# Patient Record
Sex: Female | Born: 1955 | Race: Black or African American | Hispanic: No | Smoking: Never smoker
Health system: Southern US, Community
[De-identification: ages and names within clinical notes are randomized; demographics above are authoritative.]

## PROBLEM LIST (undated history)

## (undated) ENCOUNTER — Emergency Department (HOSPITAL_BASED_OUTPATIENT_CLINIC_OR_DEPARTMENT_OTHER): Disposition: A | Discharge status: Still In Hospital | Payer: Self-pay

## (undated) DIAGNOSIS — I10 Essential (primary) hypertension: Secondary | ICD-10-CM

## (undated) DIAGNOSIS — C801 Malignant (primary) neoplasm, unspecified: Secondary | ICD-10-CM

---

## 2004-11-17 ENCOUNTER — Emergency Department (HOSPITAL_COMMUNITY): Admission: EM | Admit: 2004-11-17 | Discharge: 2004-11-18 | Payer: Self-pay | Admitting: Emergency Medicine

## 2009-02-23 ENCOUNTER — Ambulatory Visit: Payer: Self-pay | Admitting: Diagnostic Radiology

## 2009-02-23 ENCOUNTER — Observation Stay (HOSPITAL_COMMUNITY): Admission: EM | Admit: 2009-02-23 | Discharge: 2009-02-24 | Payer: Self-pay | Admitting: Internal Medicine

## 2009-02-23 ENCOUNTER — Encounter: Payer: Self-pay | Admitting: Emergency Medicine

## 2009-03-24 ENCOUNTER — Emergency Department (HOSPITAL_BASED_OUTPATIENT_CLINIC_OR_DEPARTMENT_OTHER): Admission: EM | Admit: 2009-03-24 | Discharge: 2009-03-24 | Payer: Self-pay | Admitting: Emergency Medicine

## 2009-03-26 ENCOUNTER — Emergency Department (HOSPITAL_BASED_OUTPATIENT_CLINIC_OR_DEPARTMENT_OTHER): Admission: EM | Admit: 2009-03-26 | Discharge: 2009-03-26 | Payer: Self-pay | Admitting: Emergency Medicine

## 2009-03-28 ENCOUNTER — Emergency Department (HOSPITAL_BASED_OUTPATIENT_CLINIC_OR_DEPARTMENT_OTHER): Admission: EM | Admit: 2009-03-28 | Discharge: 2009-03-28 | Payer: Self-pay | Admitting: Emergency Medicine

## 2009-06-27 ENCOUNTER — Ambulatory Visit: Payer: Self-pay | Admitting: Radiology

## 2009-06-27 ENCOUNTER — Emergency Department (HOSPITAL_BASED_OUTPATIENT_CLINIC_OR_DEPARTMENT_OTHER): Admission: EM | Admit: 2009-06-27 | Discharge: 2009-06-28 | Payer: Self-pay | Admitting: Emergency Medicine

## 2009-07-07 ENCOUNTER — Emergency Department (HOSPITAL_BASED_OUTPATIENT_CLINIC_OR_DEPARTMENT_OTHER): Admission: EM | Admit: 2009-07-07 | Discharge: 2009-07-07 | Payer: Self-pay | Admitting: Emergency Medicine

## 2010-04-09 LAB — CBC
HCT: 39.2 % (ref 36.0–46.0)
HCT: 42.8 % (ref 36.0–46.0)
Hemoglobin: 13.6 g/dL (ref 12.0–15.0)
Hemoglobin: 14.5 g/dL (ref 12.0–15.0)
MCHC: 33.9 g/dL (ref 30.0–36.0)
MCHC: 34.7 g/dL (ref 30.0–36.0)
MCV: 86.7 fL (ref 78.0–100.0)
MCV: 88.4 fL (ref 78.0–100.0)
Platelets: 284 10*3/uL (ref 150–400)
Platelets: 344 10*3/uL (ref 150–400)
RBC: 4.43 MIL/uL (ref 3.87–5.11)
RBC: 4.94 MIL/uL (ref 3.87–5.11)
RDW: 12.8 % (ref 11.5–15.5)
RDW: 13.1 % (ref 11.5–15.5)
WBC: 3.8 10*3/uL — ABNORMAL LOW (ref 4.0–10.5)
WBC: 4.2 10*3/uL (ref 4.0–10.5)

## 2010-04-09 LAB — HEMOGLOBIN A1C
Hgb A1c MFr Bld: 6.8 % — ABNORMAL HIGH (ref 4.6–6.1)
Mean Plasma Glucose: 148 mg/dL

## 2010-04-09 LAB — COMPREHENSIVE METABOLIC PANEL
ALT: <6 U/L (ref 0–35)
AST: 19 U/L (ref 0–37)
Albumin: 4.4 g/dL (ref 3.5–5.2)
Alkaline Phosphatase: 80 U/L (ref 39–117)
BUN: 15 mg/dL (ref 6–23)
CO2: 29 mEq/L (ref 19–32)
Calcium: 9.3 mg/dL (ref 8.4–10.5)
Chloride: 101 mEq/L (ref 96–112)
Creatinine, Ser: 1 mg/dL (ref 0.4–1.2)
GFR calc Af Amer: >60 mL/min (ref >60)
GFR calc non Af Amer: 58 mL/min — ABNORMAL LOW (ref >60)
Glucose, Bld: 182 mg/dL — ABNORMAL HIGH (ref 70–99)
Potassium: 3.5 mEq/L (ref 3.5–5.1)
Sodium: 140 mEq/L (ref 135–145)
Total Bilirubin: 0.5 mg/dL (ref 0.3–1.2)
Total Protein: 8.2 g/dL (ref 6.0–8.3)

## 2010-04-09 LAB — CARDIAC PANEL(CRET KIN+CKTOT+MB+TROPI)
CK, MB: 0.7 ng/mL (ref 0.3–4.0)
CK, MB: 0.8 ng/mL (ref 0.3–4.0)
CK, MB: 0.8 ng/mL (ref 0.3–4.0)
Relative Index: 0.7 (ref 0.0–2.5)
Relative Index: INVALID (ref 0.0–2.5)
Relative Index: INVALID (ref 0.0–2.5)
Total CK: 109 U/L (ref 7–177)
Total CK: 89 U/L (ref 7–177)
Total CK: 96 U/L (ref 7–177)
Troponin I: 0.01 ng/mL (ref 0.00–0.06)
Troponin I: 0.02 ng/mL (ref 0.00–0.06)
Troponin I: 0.02 ng/mL (ref 0.00–0.06)

## 2010-04-09 LAB — URINALYSIS, ROUTINE W REFLEX MICROSCOPIC
Bilirubin Urine: NEGATIVE
Glucose, UA: NEGATIVE mg/dL
Ketones, ur: NEGATIVE mg/dL
Leukocytes, UA: NEGATIVE
Nitrite: NEGATIVE
Protein, ur: NEGATIVE mg/dL
Specific Gravity, Urine: 1.014 (ref 1.005–1.030)
Urobilinogen, UA: 0.2 mg/dL (ref 0.0–1.0)
pH: 5 (ref 5.0–8.0)

## 2010-04-09 LAB — BASIC METABOLIC PANEL
BUN: 15 mg/dL (ref 6–23)
CO2: 28 mEq/L (ref 19–32)
Calcium: 9 mg/dL (ref 8.4–10.5)
Chloride: 101 mEq/L (ref 96–112)
Creatinine, Ser: 1.06 mg/dL (ref 0.4–1.2)
GFR calc Af Amer: >60 mL/min (ref >60)
GFR calc non Af Amer: 54 mL/min — ABNORMAL LOW (ref >60)
Glucose, Bld: 137 mg/dL — ABNORMAL HIGH (ref 70–99)
Potassium: 3.8 mEq/L (ref 3.5–5.1)
Sodium: 136 mEq/L (ref 135–145)

## 2010-04-09 LAB — URINE MICROSCOPIC-ADD ON

## 2010-04-09 LAB — DIFFERENTIAL
Basophils Absolute: 0 10*3/uL (ref 0.0–0.1)
Basophils Relative: 1 % (ref 0–1)
Eosinophils Absolute: 0.1 10*3/uL (ref 0.0–0.7)
Eosinophils Relative: 2 % (ref 0–5)
Lymphocytes Relative: 50 % — ABNORMAL HIGH (ref 12–46)
Lymphs Abs: 2.1 10*3/uL (ref 0.7–4.0)
Monocytes Absolute: 0.3 10*3/uL (ref 0.1–1.0)
Monocytes Relative: 7 % (ref 3–12)
Neutro Abs: 1.7 10*3/uL (ref 1.7–7.7)
Neutrophils Relative %: 39 % — ABNORMAL LOW (ref 43–77)

## 2010-04-09 LAB — POCT CARDIAC MARKERS
CKMB, poc: <1 ng/mL — ABNORMAL LOW (ref 1.0–8.0)
Myoglobin, poc: 56.3 ng/mL (ref 12–200)
Troponin i, poc: <0.05 ng/mL (ref 0.00–0.09)

## 2010-04-09 LAB — LIPASE, BLOOD: Lipase: 65 U/L (ref 23–300)

## 2010-04-09 LAB — TSH: TSH: 4.252 u[IU]/mL (ref 0.350–4.500)

## 2010-04-09 LAB — GLUCOSE, CAPILLARY
Glucose-Capillary: 103 mg/dL — ABNORMAL HIGH (ref 70–99)
Glucose-Capillary: 114 mg/dL — ABNORMAL HIGH (ref 70–99)
Glucose-Capillary: 116 mg/dL — ABNORMAL HIGH (ref 70–99)
Glucose-Capillary: 124 mg/dL — ABNORMAL HIGH (ref 70–99)
Glucose-Capillary: 94 mg/dL (ref 70–99)
Glucose-Capillary: 98 mg/dL (ref 70–99)

## 2010-04-09 LAB — LIPID PANEL
Cholesterol: 248 mg/dL — ABNORMAL HIGH (ref 0–200)
HDL: 80 mg/dL (ref >39)
LDL Cholesterol: 155 mg/dL — ABNORMAL HIGH (ref 0–99)
Total CHOL/HDL Ratio: 3.1 RATIO
Triglycerides: 64 mg/dL (ref <150)
VLDL: 13 mg/dL (ref 0–40)

## 2010-04-09 LAB — MAGNESIUM: Magnesium: 2 mg/dL (ref 1.5–2.5)

## 2010-04-13 LAB — GLUCOSE, CAPILLARY: Glucose-Capillary: 134 mg/dL — ABNORMAL HIGH (ref 70–99)

## 2010-04-23 ENCOUNTER — Emergency Department (HOSPITAL_BASED_OUTPATIENT_CLINIC_OR_DEPARTMENT_OTHER)
Admission: EM | Admit: 2010-04-23 | Discharge: 2010-04-23 | Disposition: A | Discharge status: Home / Self Care | Payer: Self-pay | Attending: Emergency Medicine | Admitting: Emergency Medicine

## 2010-04-23 DIAGNOSIS — IMO0001 Reserved for inherently not codable concepts without codable children: Secondary | ICD-10-CM | POA: Insufficient documentation

## 2010-04-23 DIAGNOSIS — I10 Essential (primary) hypertension: Secondary | ICD-10-CM | POA: Insufficient documentation

## 2010-04-23 DIAGNOSIS — E039 Hypothyroidism, unspecified: Secondary | ICD-10-CM | POA: Insufficient documentation

## 2010-04-23 DIAGNOSIS — E119 Type 2 diabetes mellitus without complications: Secondary | ICD-10-CM | POA: Insufficient documentation

## 2010-06-08 ENCOUNTER — Emergency Department (HOSPITAL_BASED_OUTPATIENT_CLINIC_OR_DEPARTMENT_OTHER)
Admission: EM | Admit: 2010-06-08 | Discharge: 2010-06-08 | Disposition: A | Discharge status: Home / Self Care | Payer: Self-pay | Attending: Emergency Medicine | Admitting: Emergency Medicine

## 2010-06-08 ENCOUNTER — Emergency Department (INDEPENDENT_AMBULATORY_CARE_PROVIDER_SITE_OTHER): Payer: Self-pay

## 2010-06-08 DIAGNOSIS — E039 Hypothyroidism, unspecified: Secondary | ICD-10-CM | POA: Insufficient documentation

## 2010-06-08 DIAGNOSIS — R079 Chest pain, unspecified: Secondary | ICD-10-CM

## 2010-06-08 DIAGNOSIS — E119 Type 2 diabetes mellitus without complications: Secondary | ICD-10-CM | POA: Insufficient documentation

## 2010-06-08 DIAGNOSIS — I10 Essential (primary) hypertension: Secondary | ICD-10-CM | POA: Insufficient documentation

## 2010-06-08 DIAGNOSIS — R071 Chest pain on breathing: Secondary | ICD-10-CM | POA: Insufficient documentation

## 2010-06-08 LAB — BASIC METABOLIC PANEL
CO2: 29 mEq/L (ref 19–32)
Calcium: 10.2 mg/dL (ref 8.4–10.5)
Creatinine, Ser: 1 mg/dL (ref 0.4–1.2)
GFR calc Af Amer: >60 mL/min (ref >60)
GFR calc non Af Amer: 57 mL/min — ABNORMAL LOW (ref >60)
Glucose, Bld: 168 mg/dL — ABNORMAL HIGH (ref 70–99)
Sodium: 137 mEq/L (ref 135–145)

## 2010-06-08 LAB — CARDIAC PANEL(CRET KIN+CKTOT+MB+TROPI)
CK, MB: 1.8 ng/mL (ref 0.3–4.0)
Total CK: 133 U/L (ref 7–177)

## 2010-06-08 LAB — DIFFERENTIAL
Basophils Absolute: 0 10*3/uL (ref 0.0–0.1)
Eosinophils Absolute: 0.1 10*3/uL (ref 0.0–0.7)
Eosinophils Relative: 1 % (ref 0–5)
Monocytes Absolute: 0.4 10*3/uL (ref 0.1–1.0)

## 2010-06-08 LAB — CBC
MCHC: 35.1 g/dL (ref 30.0–36.0)
Platelets: 329 10*3/uL (ref 150–400)
RDW: 13 % (ref 11.5–15.5)
WBC: 4.6 10*3/uL (ref 4.0–10.5)

## 2010-06-08 LAB — CK TOTAL AND CKMB (NOT AT ARMC)
CK, MB: 1.7 ng/mL (ref 0.3–4.0)
Relative Index: 1.4 (ref 0.0–2.5)

## 2010-07-01 ENCOUNTER — Emergency Department (HOSPITAL_BASED_OUTPATIENT_CLINIC_OR_DEPARTMENT_OTHER)
Admission: EM | Admit: 2010-07-01 | Discharge: 2010-07-01 | Disposition: A | Discharge status: Home / Self Care | Payer: Self-pay | Attending: Emergency Medicine | Admitting: Emergency Medicine

## 2010-07-01 DIAGNOSIS — J029 Acute pharyngitis, unspecified: Secondary | ICD-10-CM | POA: Insufficient documentation

## 2010-07-01 DIAGNOSIS — E039 Hypothyroidism, unspecified: Secondary | ICD-10-CM | POA: Insufficient documentation

## 2010-07-01 DIAGNOSIS — I10 Essential (primary) hypertension: Secondary | ICD-10-CM | POA: Insufficient documentation

## 2010-07-01 DIAGNOSIS — E119 Type 2 diabetes mellitus without complications: Secondary | ICD-10-CM | POA: Insufficient documentation

## 2010-11-10 ENCOUNTER — Encounter: Payer: Self-pay | Admitting: *Deleted

## 2010-11-10 ENCOUNTER — Emergency Department (HOSPITAL_BASED_OUTPATIENT_CLINIC_OR_DEPARTMENT_OTHER)
Admission: EM | Admit: 2010-11-10 | Discharge: 2010-11-10 | Disposition: A | Discharge status: Home / Self Care | Payer: Self-pay | Attending: Emergency Medicine | Admitting: Emergency Medicine

## 2010-11-10 ENCOUNTER — Emergency Department (INDEPENDENT_AMBULATORY_CARE_PROVIDER_SITE_OTHER): Payer: Self-pay

## 2010-11-10 DIAGNOSIS — I10 Essential (primary) hypertension: Secondary | ICD-10-CM | POA: Insufficient documentation

## 2010-11-10 DIAGNOSIS — M25539 Pain in unspecified wrist: Secondary | ICD-10-CM

## 2010-11-10 DIAGNOSIS — M7989 Other specified soft tissue disorders: Secondary | ICD-10-CM

## 2010-11-10 DIAGNOSIS — IMO0002 Reserved for concepts with insufficient information to code with codable children: Secondary | ICD-10-CM | POA: Insufficient documentation

## 2010-11-10 DIAGNOSIS — S6730XA Crushing injury of unspecified wrist, initial encounter: Secondary | ICD-10-CM | POA: Insufficient documentation

## 2010-11-10 DIAGNOSIS — E119 Type 2 diabetes mellitus without complications: Secondary | ICD-10-CM | POA: Insufficient documentation

## 2010-11-10 DIAGNOSIS — W230XXA Caught, crushed, jammed, or pinched between moving objects, initial encounter: Secondary | ICD-10-CM

## 2010-11-10 HISTORY — DX: Malignant (primary) neoplasm, unspecified: C80.1

## 2010-11-10 HISTORY — DX: Essential (primary) hypertension: I10

## 2010-11-10 MED ORDER — HYDROCODONE-ACETAMINOPHEN 5-325 MG PO TABS: 1.0000 | ORAL_TABLET | Freq: Four times a day (QID) | ORAL | Status: AC | PRN

## 2010-11-10 MED ORDER — HYDROCODONE-ACETAMINOPHEN 5-325 MG PO TABS
2.0000 | ORAL_TABLET | Freq: Once | ORAL | Status: AC
  Administered 2010-11-10: 2 via ORAL
  Filled 2010-11-10: qty 2
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
## 2011-06-29 ENCOUNTER — Emergency Department (HOSPITAL_BASED_OUTPATIENT_CLINIC_OR_DEPARTMENT_OTHER): Payer: No Typology Code available for payment source

## 2011-06-29 DIAGNOSIS — I10 Essential (primary) hypertension: Secondary | ICD-10-CM | POA: Insufficient documentation

## 2011-06-29 DIAGNOSIS — S60219A Contusion of unspecified wrist, initial encounter: Secondary | ICD-10-CM | POA: Insufficient documentation

## 2011-06-29 DIAGNOSIS — M25539 Pain in unspecified wrist: Secondary | ICD-10-CM | POA: Insufficient documentation
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
## 2011-06-30 ENCOUNTER — Emergency Department (HOSPITAL_BASED_OUTPATIENT_CLINIC_OR_DEPARTMENT_OTHER)
Admission: EM | Admit: 2011-06-30 | Discharge: 2011-06-30 | Disposition: A | Discharge status: Home / Self Care | Payer: No Typology Code available for payment source | Attending: Emergency Medicine | Admitting: Emergency Medicine

## 2011-06-30 ENCOUNTER — Encounter (HOSPITAL_BASED_OUTPATIENT_CLINIC_OR_DEPARTMENT_OTHER): Payer: Self-pay | Admitting: Emergency Medicine

## 2011-06-30 DIAGNOSIS — S60219A Contusion of unspecified wrist, initial encounter: Secondary | ICD-10-CM

## 2011-06-30 MED ORDER — OXYCODONE-ACETAMINOPHEN 5-325 MG PO TABS
2.0000 | ORAL_TABLET | Freq: Once | ORAL | Status: AC
  Administered 2011-06-30: 2 via ORAL

## 2011-06-30 MED ORDER — OXYCODONE-ACETAMINOPHEN 5-325 MG PO TABS
ORAL_TABLET | ORAL | Status: AC
  Administered 2011-06-30: 2 via ORAL
  Filled 2011-06-30: qty 2
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
## 2011-06-30 NOTE — ED Notes (Signed)
Pt provided downtime d/c instructions

## 2011-06-30 NOTE — Discharge Instructions (Signed)

## 2011-06-30 NOTE — ED Notes (Signed)
Pt rated pain 6/10 , percocet given and wrist splint applied prior to d/c

## 2011-08-19 ENCOUNTER — Emergency Department (HOSPITAL_BASED_OUTPATIENT_CLINIC_OR_DEPARTMENT_OTHER)
Admission: EM | Admit: 2011-08-19 | Discharge: 2011-08-19 | Disposition: A | Discharge status: Home / Self Care | Payer: Self-pay | Attending: Emergency Medicine | Admitting: Emergency Medicine

## 2011-08-19 ENCOUNTER — Encounter (HOSPITAL_BASED_OUTPATIENT_CLINIC_OR_DEPARTMENT_OTHER): Payer: Self-pay | Admitting: Emergency Medicine

## 2011-08-19 DIAGNOSIS — L089 Local infection of the skin and subcutaneous tissue, unspecified: Secondary | ICD-10-CM | POA: Insufficient documentation

## 2011-08-19 DIAGNOSIS — I10 Essential (primary) hypertension: Secondary | ICD-10-CM | POA: Insufficient documentation

## 2011-08-19 DIAGNOSIS — E119 Type 2 diabetes mellitus without complications: Secondary | ICD-10-CM | POA: Insufficient documentation

## 2011-08-19 LAB — GLUCOSE, CAPILLARY: Glucose-Capillary: 130 mg/dL — ABNORMAL HIGH (ref 70–99)

## 2011-08-19 MED ORDER — DOXYCYCLINE HYCLATE 100 MG PO CAPS: 100.0000 mg | ORAL_CAPSULE | Freq: Two times a day (BID) | ORAL | Status: AC

## 2011-08-19 NOTE — ED Notes (Signed)
Noticed boil on her inner left leg.  C/o pain and swelling.

## 2011-08-19 NOTE — ED Provider Notes (Signed)
History     CSN: [id number]  Arrival date & time 08/19/11  1802   First MD Initiated Contact with Patient 08/19/11 1853      Chief Complaint  Patient presents with  . Abscess    (Consider location/radiation/quality/duration/timing/severity/associated sxs/prior treatment) Patient is a 56 y.o. female presenting with rash. The history is provided by the patient. No language interpreter was used.  Rash  This is a new problem. The problem has been gradually worsening. The problem is associated with nothing. There has been no fever. The rash is present on the right lower leg. The pain is mild. The pain has been constant since onset. Associated symptoms include blisters.   Pt complains of 2 pustules right lower leg,  Pt has a history of an abscess right leg that started the same way.   Pt has a history of diabetes.  o medications, diet controlled Past Medical History  Diagnosis Date  . Glaucoma   . Diabetes mellitus   . Hypertension   . Cancer     History reviewed. No pertinent past surgical history.  No family history on file.  History  Substance Use Topics  . Smoking status: Never Smoker   . Smokeless tobacco: Not on file  . Alcohol Use: No    OB History    Grav Para Term Preterm Abortions TAB SAB Ect Mult Living                  Review of Systems  Skin: Positive for rash.  All other systems reviewed and are negative.    Allergies  Sulfa drugs cross reactors; Ibuprofen; and Ivp dye  Home Medications   Current Outpatient Rx  Name Route Sig Dispense Refill  . ACETAMINOPHEN-CODEINE #3 300-30 MG PO TABS Oral Take 1 tablet by mouth every 4 (four) hours as needed. For pain    . ASPIRIN EC 81 MG PO TBEC Oral Take 81 mg by mouth every other day.     Marland Kitchen DIAZEPAM 5 MG PO TABS Oral Take 5 mg by mouth daily as needed. For anxiety    . HYDROCHLOROTHIAZIDE 25 MG PO TABS Oral Take 25 mg by mouth daily.        BP 111/97  Pulse 78  Temp 97.9 F (36.6 C) (Oral)  Resp 16   SpO2 100%  LMP 09/11/2010  Physical Exam  Nursing note and vitals reviewed. Constitutional: She is oriented to person, place, and time. She appears well-developed and well-nourished.  HENT:  Head: Normocephalic and atraumatic.  Right Ear: External ear normal.  Left Ear: External ear normal.  Nose: Nose normal.  Mouth/Throat: Oropharynx is clear and moist.  Eyes: Pupils are equal, round, and reactive to light.  Musculoskeletal: Normal range of motion.  Neurological: She is alert and oriented to person, place, and time. She has normal reflexes.  Skin: There is erythema.       2 pustules right lower leg, erythema around area  Psychiatric: She has a normal mood and affect.    ED Course  Procedures (including critical care time)  Labs Reviewed - No data to display No results found.   1. Skin infection       MDM  Pt placed on rx for doxycycline.   I advised followup with primary Md for recheck.   Return if symptoms worsen or cahnge.  Pt counseled on glucose of 130        Lonia Skinner [place], [place] 08/19/11 1916  Elson Areas, [place]  08/19/11 1918 

## 2011-08-20 NOTE — ED Provider Notes (Signed)
Medical screening examination/treatment/procedure(s) were performed by non-physician practitioner and as supervising physician I was immediately available for consultation/collaboration.   Gwyneth Sprout, MD 08/20/11 [phone]

## 2012-02-27 ENCOUNTER — Emergency Department (HOSPITAL_BASED_OUTPATIENT_CLINIC_OR_DEPARTMENT_OTHER)
Admission: EM | Admit: 2012-02-27 | Discharge: 2012-02-27 | Disposition: A | Discharge status: Home / Self Care | Payer: Self-pay | Attending: Emergency Medicine | Admitting: Emergency Medicine

## 2012-02-27 ENCOUNTER — Encounter (HOSPITAL_BASED_OUTPATIENT_CLINIC_OR_DEPARTMENT_OTHER): Payer: Self-pay | Admitting: *Deleted

## 2012-02-27 DIAGNOSIS — Z7982 Long term (current) use of aspirin: Secondary | ICD-10-CM | POA: Insufficient documentation

## 2012-02-27 DIAGNOSIS — E119 Type 2 diabetes mellitus without complications: Secondary | ICD-10-CM | POA: Insufficient documentation

## 2012-02-27 DIAGNOSIS — H6690 Otitis media, unspecified, unspecified ear: Secondary | ICD-10-CM | POA: Insufficient documentation

## 2012-02-27 DIAGNOSIS — Z859 Personal history of malignant neoplasm, unspecified: Secondary | ICD-10-CM | POA: Insufficient documentation

## 2012-02-27 DIAGNOSIS — Z79899 Other long term (current) drug therapy: Secondary | ICD-10-CM | POA: Insufficient documentation

## 2012-02-27 DIAGNOSIS — K1370 Unspecified lesions of oral mucosa: Secondary | ICD-10-CM | POA: Insufficient documentation

## 2012-02-27 DIAGNOSIS — I10 Essential (primary) hypertension: Secondary | ICD-10-CM | POA: Insufficient documentation

## 2012-02-27 DIAGNOSIS — Z8669 Personal history of other diseases of the nervous system and sense organs: Secondary | ICD-10-CM | POA: Insufficient documentation

## 2012-02-27 MED ORDER — HYDROCODONE-ACETAMINOPHEN 5-325 MG PO TABS: 2.0000 | ORAL_TABLET | ORAL | Status: DC | PRN

## 2012-02-27 MED ORDER — AMOXICILLIN 500 MG PO TABS: 500.0000 mg | ORAL_TABLET | Freq: Two times a day (BID) | ORAL | Status: DC

## 2012-02-27 NOTE — ED Notes (Signed)
Pt c/o H/A and bilat ear pain as well as tooth pain x 2 weeks. Dizziness x 3 days. PERL. Clr drainage from ears.

## 2012-02-27 NOTE — ED Provider Notes (Signed)
History     CSN: [id number]  Arrival date & time 02/27/12  1321   First MD Initiated Contact with Patient 02/27/12 1610      Chief Complaint  Patient presents with  . Otalgia    (Consider location/radiation/quality/duration/timing/severity/associated sxs/prior treatment) Patient is a 57 y.o. female presenting with ear pain. The history is provided by the patient. No language interpreter was used.  Otalgia Location:  Bilateral Quality:  Aching and burning Severity:  Moderate Onset quality:  Gradual Duration:  2 weeks Timing:  Constant Progression:  Worsening Chronicity:  New Relieved by:  Nothing Ineffective treatments:  OTC medications Associated symptoms comment:  Tooth pain   Past Medical History  Diagnosis Date  . Glaucoma(365)   . Diabetes mellitus   . Hypertension   . Cancer     History reviewed. No pertinent past surgical history.  History reviewed. No pertinent family history.  History  Substance Use Topics  . Smoking status: Never Smoker   . Smokeless tobacco: Not on file  . Alcohol Use: No    OB History   Grav Para Term Preterm Abortions TAB SAB Ect Mult Living                  Review of Systems  HENT: Positive for ear pain and mouth sores.     Allergies  Sulfa drugs cross reactors; Ibuprofen; and Ivp dye  Home Medications   Current Outpatient Rx  Name  Route  Sig  Dispense  Refill  . acetaminophen-codeine (TYLENOL #3) 300-30 MG per tablet   Oral   Take 1 tablet by mouth every 4 (four) hours as needed. For pain         . aspirin EC 81 MG tablet   Oral   Take 81 mg by mouth every other day.          . diazepam (VALIUM) 5 MG tablet   Oral   Take 5 mg by mouth daily as needed. For anxiety         . hydrochlorothiazide (HYDRODIURIL) 25 MG tablet   Oral   Take 25 mg by mouth daily.             BP 152/82  Pulse 104  Temp(Src) 98.4 F (36.9 C) (Oral)  Resp 16  Ht 5\' 4"  (1.626 m)  Wt 165 lb (74.844 kg)  BMI 28.31 kg/m2   SpO2 100%  LMP 09/11/2010  Physical Exam  Nursing note and vitals reviewed. Constitutional: She is oriented to person, place, and time. She appears well-developed and well-nourished.  HENT:  Head: Normocephalic.  Right Ear: External ear normal.  Left Ear: External ear normal.  Nose: Nose normal.  Mouth/Throat: Oropharynx is clear and moist.  Eyes: Conjunctivae are normal. Pupils are equal, round, and reactive to light.  Neck: Normal range of motion. Neck supple.  Cardiovascular: Normal rate and normal heart sounds.   Pulmonary/Chest: Effort normal.  Abdominal: Soft. Bowel sounds are normal.  Musculoskeletal: Normal range of motion.  Neurological: She is alert and oriented to person, place, and time. She has normal reflexes.  Skin: Skin is warm.  Psychiatric: She has a normal mood and affect.    ED Course  Procedures (including critical care time)  Labs Reviewed - No data to display No results found.   No diagnosis found.    MDM  amoxicillian 500mg  tid and hydrocodone for pain.    Pt given primary care referrals.       Lonia Skinner  [place], [place] 02/27/12 1637

## 2012-02-28 NOTE — ED Provider Notes (Signed)
Medical screening examination/treatment/procedure(s) were performed by non-physician practitioner and as supervising physician I was immediately available for consultation/collaboration.   Richardean Canal, MD 02/28/12 [phone]

## 2012-09-01 ENCOUNTER — Encounter (HOSPITAL_BASED_OUTPATIENT_CLINIC_OR_DEPARTMENT_OTHER): Payer: Self-pay

## 2012-09-01 ENCOUNTER — Emergency Department (HOSPITAL_BASED_OUTPATIENT_CLINIC_OR_DEPARTMENT_OTHER)
Admission: EM | Admit: 2012-09-01 | Discharge: 2012-09-01 | Disposition: A | Discharge status: Home / Self Care | Payer: BC Managed Care – PPO | Attending: Emergency Medicine | Admitting: Emergency Medicine

## 2012-09-01 DIAGNOSIS — Z79899 Other long term (current) drug therapy: Secondary | ICD-10-CM | POA: Insufficient documentation

## 2012-09-01 DIAGNOSIS — R51 Headache: Secondary | ICD-10-CM | POA: Insufficient documentation

## 2012-09-01 DIAGNOSIS — H409 Unspecified glaucoma: Secondary | ICD-10-CM | POA: Insufficient documentation

## 2012-09-01 DIAGNOSIS — H9209 Otalgia, unspecified ear: Secondary | ICD-10-CM | POA: Insufficient documentation

## 2012-09-01 DIAGNOSIS — Z7982 Long term (current) use of aspirin: Secondary | ICD-10-CM | POA: Insufficient documentation

## 2012-09-01 DIAGNOSIS — H9203 Otalgia, bilateral: Secondary | ICD-10-CM

## 2012-09-01 DIAGNOSIS — I10 Essential (primary) hypertension: Secondary | ICD-10-CM | POA: Insufficient documentation

## 2012-09-01 DIAGNOSIS — H9210 Otorrhea, unspecified ear: Secondary | ICD-10-CM | POA: Insufficient documentation

## 2012-09-01 DIAGNOSIS — R110 Nausea: Secondary | ICD-10-CM | POA: Insufficient documentation

## 2012-09-01 DIAGNOSIS — Z859 Personal history of malignant neoplasm, unspecified: Secondary | ICD-10-CM | POA: Insufficient documentation

## 2012-09-01 DIAGNOSIS — H9319 Tinnitus, unspecified ear: Secondary | ICD-10-CM | POA: Insufficient documentation

## 2012-09-01 DIAGNOSIS — E119 Type 2 diabetes mellitus without complications: Secondary | ICD-10-CM | POA: Insufficient documentation

## 2012-09-01 LAB — GLUCOSE, CAPILLARY: Glucose-Capillary: 137 mg/dL — ABNORMAL HIGH (ref 70–99)

## 2012-09-01 MED ORDER — ACETAMINOPHEN 500 MG PO TABS
1000.0000 mg | ORAL_TABLET | Freq: Once | ORAL | Status: AC
  Administered 2012-09-01: 1000 mg via ORAL
  Filled 2012-09-01: qty 2

## 2012-09-01 MED ORDER — CETIRIZINE-PSEUDOEPHEDRINE ER 5-120 MG PO TB12: 1.0000 | ORAL_TABLET | Freq: Two times a day (BID) | ORAL | Status: AC

## 2012-09-01 NOTE — ED Notes (Signed)
Patient here with bilateral earache for the past few weeks, reports some drainage from both as well. Also has fullness and general headache for same. Denies trauma

## 2012-09-01 NOTE — ED Provider Notes (Signed)
CSN: [id number]     Arrival date & time 09/01/12  1527 History     First MD Initiated Contact with Patient 09/01/12 1528     Chief Complaint  Patient presents with  . Otalgia   (Consider location/radiation/quality/duration/timing/severity/associated sxs/prior Treatment) HPI Comments: Patient is a 57 year old female with a history of glaucoma, diabetes mellitus, and hypertension who presents for bilateral earache x3 weeks. Patient states the pain is pressure-like and nonradiating. Symptoms have been associated with intermittent tinnitus, clear ear drainage, and frontal "band like" headaches. Patient has tried Tylenol and ibuprofen without relief of symptoms. She also admits to associated nausea which began this morning. Patient denies fever, vision changes or vision loss, hearing loss, difficulty speaking or swallowing, facial droop, nasal congestion or rhinorrhea, chest pain, shortness of breath, vomiting, and extremity weakness. Patient further denies head injury or trauma.  Patient is a 57 y.o. female presenting with ear pain. The history is provided by the patient. No language interpreter was used.  Otalgia Associated symptoms: ear discharge   Associated symptoms: no abdominal pain, no congestion, no fever, no hearing loss, no neck pain, no rhinorrhea and no vomiting     Past Medical History  Diagnosis Date  . Glaucoma   . Diabetes mellitus   . Hypertension   . Cancer    History reviewed. No pertinent past surgical history. No family history on file. History  Substance Use Topics  . Smoking status: Never Smoker   . Smokeless tobacco: Not on file  . Alcohol Use: No   OB History   Grav Para Term Preterm Abortions TAB SAB Ect Mult Living                 Review of Systems  Constitutional: Negative for fever.  HENT: Positive for ear pain and ear discharge. Negative for hearing loss, congestion, rhinorrhea, trouble swallowing, neck pain and neck stiffness.   Eyes: Negative for  visual disturbance.  Respiratory: Negative for shortness of breath.   Cardiovascular: Negative for chest pain.  Gastrointestinal: Positive for nausea. Negative for vomiting and abdominal pain.  Neurological: Negative for weakness and numbness.  All other systems reviewed and are negative.   Allergies  Sulfa drugs cross reactors; Ibuprofen; and Ivp dye  Home Medications   Current Outpatient Rx  Name  Route  Sig  Dispense  Refill  . aspirin EC 81 MG tablet   Oral   Take 81 mg by mouth every other day.          . cetirizine-pseudoephedrine (ZYRTEC-D) 5-120 MG per tablet   Oral   Take 1 tablet by mouth 2 (two) times daily.   30 tablet   0   . hydrochlorothiazide (HYDRODIURIL) 25 MG tablet   Oral   Take 25 mg by mouth daily.            BP 136/87  Pulse 79  Temp(Src) 98.4 F (36.9 C) (Oral)  Resp 16  Ht 5\' 4"  (1.626 m)  Wt 175 lb (79.379 kg)  BMI 30.02 kg/m2  SpO2 98%  LMP 09/11/2010  Physical Exam  Nursing note and vitals reviewed. Constitutional: She is oriented to person, place, and time. She appears well-developed and well-nourished. No distress.  HENT:  Head: Normocephalic and atraumatic.  Right Ear: Tympanic membrane and ear canal normal. No drainage. No mastoid tenderness.  Left Ear: Tympanic membrane, external ear and ear canal normal. No drainage. No mastoid tenderness.  Nose: Nose normal.  Mouth/Throat: Uvula is midline, oropharynx is  clear and moist and mucous membranes are normal.  Eyes: Conjunctivae and EOM are normal. Pupils are equal, round, and reactive to light. No scleral icterus.  Neck: Normal range of motion. Neck supple.  Cardiovascular: Normal rate, regular rhythm and normal heart sounds.   Pulmonary/Chest: Effort normal and breath sounds normal. No respiratory distress. She has no wheezes. She has no rales.  Lymphadenopathy:    She has no cervical adenopathy.  Neurological: She is alert and oriented to person, place, and time.  Patient  speaks in full goal oriented sentences. Cranial nerves III through XII grossly intact. Patient has equal grip strength bilaterally with 5 out of 5 strength against resistance in her upper and lower extremities. DTRs normal and symmetric. No sensory or motor deficits appreciated.  Skin: Skin is warm and dry. No rash noted. She is not diaphoretic. No erythema. No pallor.  Psychiatric: She has a normal mood and affect. Her behavior is normal.   ED Course   Procedures (including critical care time)  Labs Reviewed  GLUCOSE, CAPILLARY - Abnormal; Notable for the following:    Glucose-Capillary 137 (*)    All other components within normal limits   No results found.  1. Otalgia of both ears    MDM  57 y/o female who presents for b/l otalgia x 3 weeks. Patient neurovascularly intact, hemodynamically stable, and afebrile. There is no mastoid TTP or other evidence of mastoiditis. No neck pain or nuchal rigidity. No skin changes or rash. No facial droop. Symptoms most c/w with sinus congestion causing b/l otalgia. Patient appropriate for d/c with Rx for Zyrtec-D for symptoms. PCP follow up advised and referral to wellness center given. Indications for ED return discussed and patient agreeable to plan.    Antony Madura, PA-C 09/06/12 1404

## 2012-09-15 NOTE — ED Provider Notes (Signed)
Medical screening examination/treatment/procedure(s) were performed by non-physician practitioner and as supervising physician I was immediately available for consultation/collaboration.   Rolan Bucco, MD 09/15/12 [phone]

## 2012-12-03 IMAGING — CR DG WRIST COMPLETE 3+V*R*
4 series · 4 of 4 positions shown · non-contrast
Comparison: 11/10/2010

CLINICAL DATA: Trauma and pain.

RIGHT WRIST - COMPLETE 3+ VIEW

[x wrist pa right]
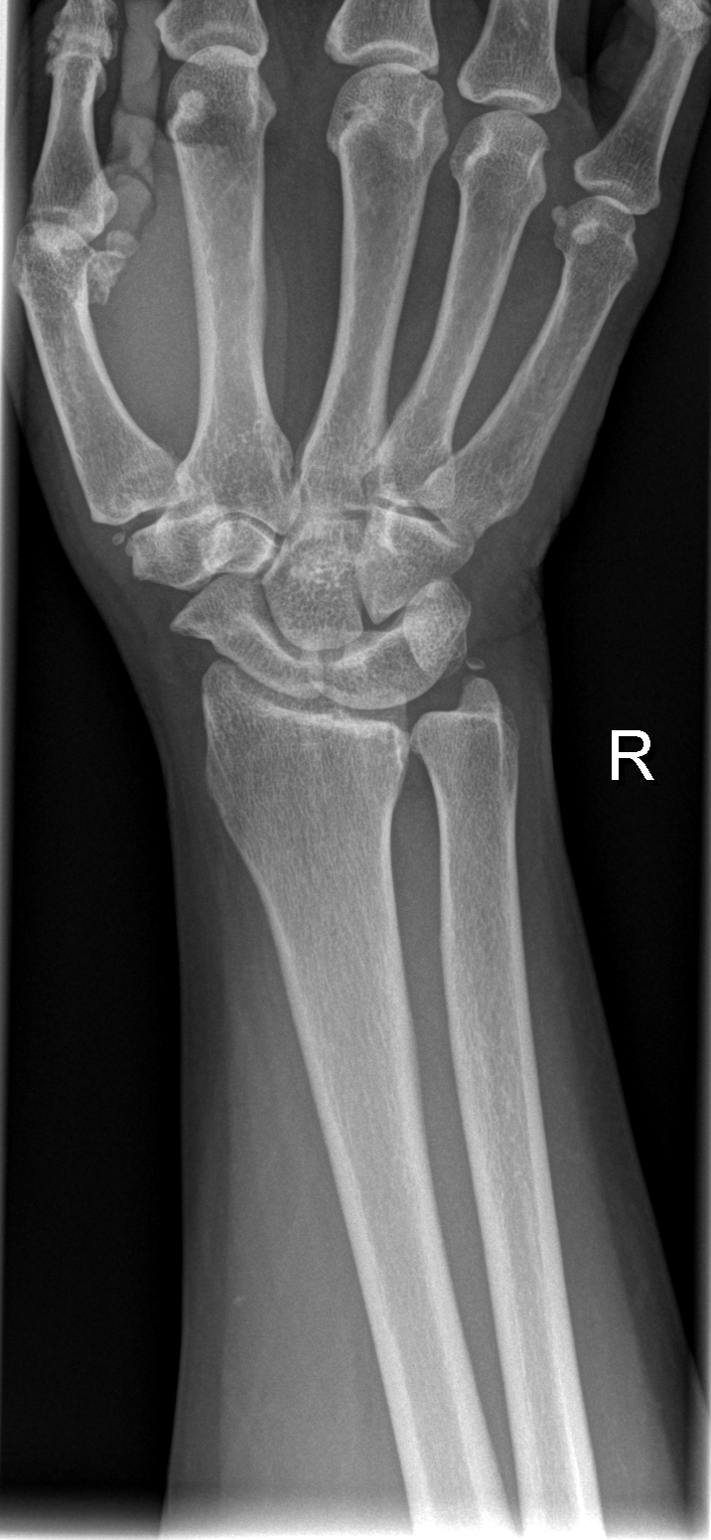

[x wrist obl right]
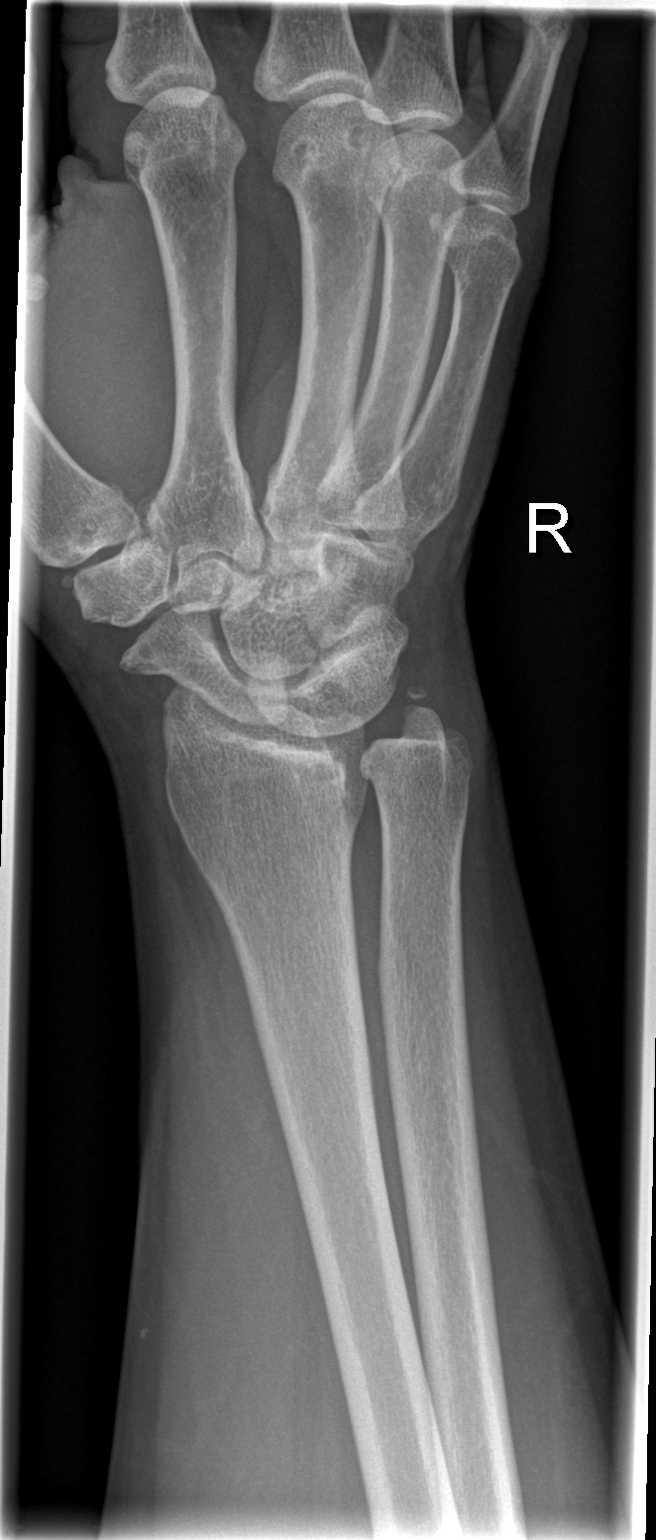

[x wrist lat right]
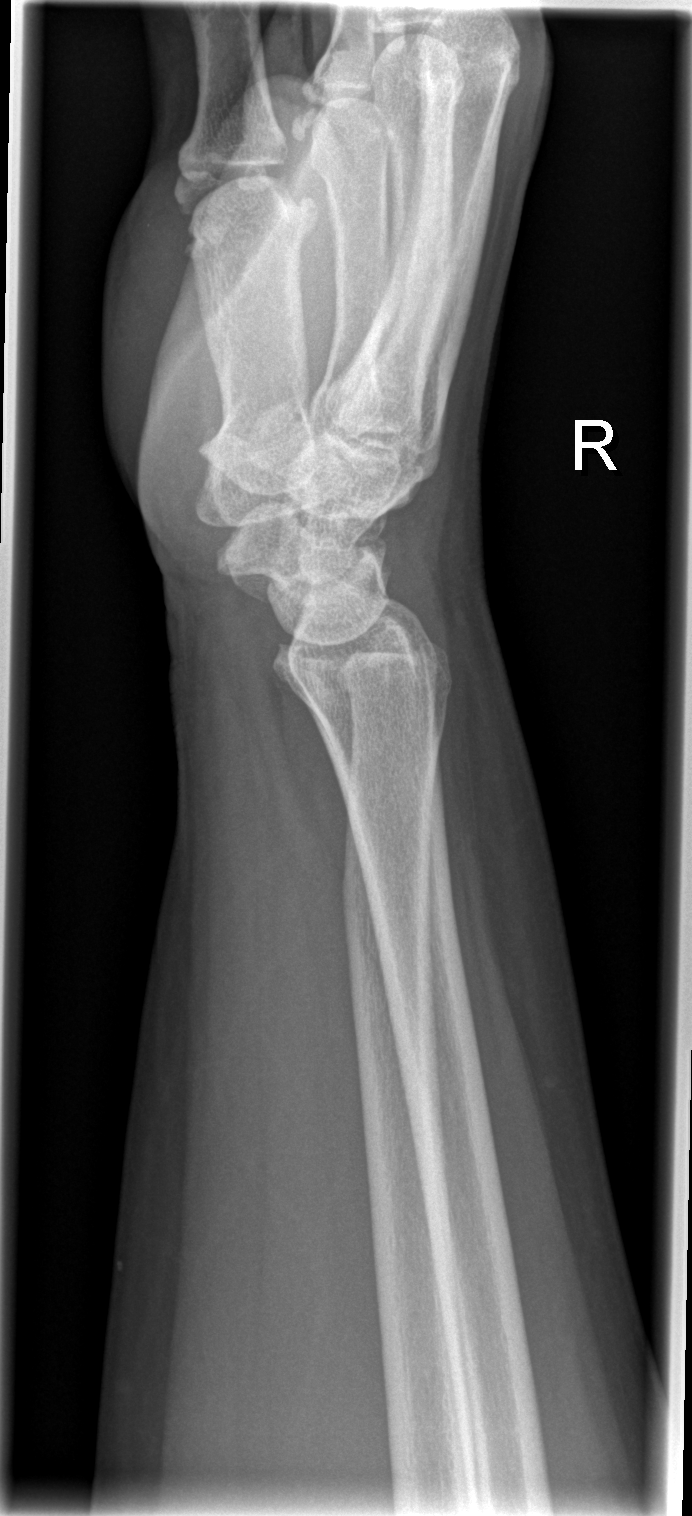

[x navicular]
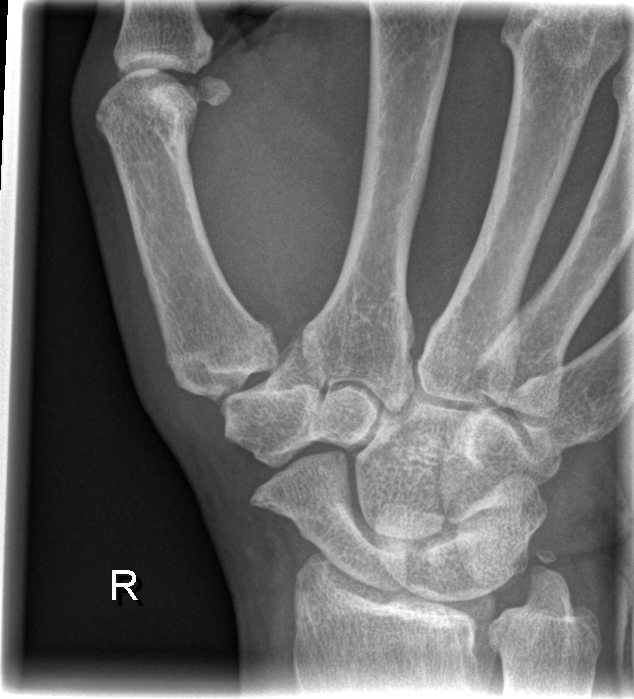

[4 of 4 positions shown; findings below may reference images not displayed]

FINDINGS: Remote post-traumatic deformity at the ulnar styloid.
Mild radiocarpal osteoarthritis. No acute fracture or dislocation.
Scaphoid intact.
IMPRESSION: Degenerative change, without acute finding.

## 2013-02-12 ENCOUNTER — Emergency Department (HOSPITAL_BASED_OUTPATIENT_CLINIC_OR_DEPARTMENT_OTHER)
Admission: EM | Admit: 2013-02-12 | Discharge: 2013-02-13 | Discharge status: Against Medical Advice | Payer: BC Managed Care – PPO | Attending: Emergency Medicine | Admitting: Emergency Medicine

## 2013-02-12 ENCOUNTER — Encounter (HOSPITAL_BASED_OUTPATIENT_CLINIC_OR_DEPARTMENT_OTHER): Payer: Self-pay | Admitting: Emergency Medicine

## 2013-02-12 DIAGNOSIS — T498X5A Adverse effect of other topical agents, initial encounter: Secondary | ICD-10-CM | POA: Insufficient documentation

## 2013-02-12 DIAGNOSIS — R209 Unspecified disturbances of skin sensation: Secondary | ICD-10-CM | POA: Insufficient documentation
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
## 2023-07-05 ENCOUNTER — Emergency Department (HOSPITAL_BASED_OUTPATIENT_CLINIC_OR_DEPARTMENT_OTHER)

## 2023-07-05 ENCOUNTER — Encounter (HOSPITAL_BASED_OUTPATIENT_CLINIC_OR_DEPARTMENT_OTHER): Payer: Self-pay | Admitting: Emergency Medicine

## 2023-07-05 ENCOUNTER — Other Ambulatory Visit: Payer: Self-pay

## 2023-07-05 ENCOUNTER — Emergency Department (HOSPITAL_BASED_OUTPATIENT_CLINIC_OR_DEPARTMENT_OTHER): Admission: EM | Admit: 2023-07-05 | Discharge: 2023-07-06 | Disposition: A | Discharge status: Home / Self Care

## 2023-07-05 DIAGNOSIS — R519 Headache, unspecified: Secondary | ICD-10-CM | POA: Insufficient documentation

## 2023-07-05 DIAGNOSIS — I2510 Atherosclerotic heart disease of native coronary artery without angina pectoris: Secondary | ICD-10-CM | POA: Insufficient documentation

## 2023-07-05 DIAGNOSIS — E119 Type 2 diabetes mellitus without complications: Secondary | ICD-10-CM | POA: Insufficient documentation

## 2023-07-05 DIAGNOSIS — I7774 Dissection of vertebral artery: Secondary | ICD-10-CM

## 2023-07-05 DIAGNOSIS — R42 Dizziness and giddiness: Secondary | ICD-10-CM | POA: Diagnosis present

## 2023-07-05 DIAGNOSIS — Z7982 Long term (current) use of aspirin: Secondary | ICD-10-CM | POA: Diagnosis not present

## 2023-07-05 DIAGNOSIS — E876 Hypokalemia: Secondary | ICD-10-CM | POA: Diagnosis not present

## 2023-07-05 DIAGNOSIS — I10 Essential (primary) hypertension: Secondary | ICD-10-CM | POA: Insufficient documentation

## 2023-07-05 LAB — COMPREHENSIVE METABOLIC PANEL WITH GFR
ALT: 9 U/L (ref 0–44)
AST: 20 U/L (ref 15–41)
Albumin: 4.6 g/dL (ref 3.5–5.0)
Alkaline Phosphatase: 80 U/L (ref 38–126)
Anion gap: 17 — ABNORMAL HIGH (ref 5–15)
BUN: 9 mg/dL (ref 8–23)
CO2: 27 mmol/L (ref 22–32)
Calcium: 9.9 mg/dL (ref 8.9–10.3)
Chloride: 96 mmol/L — ABNORMAL LOW (ref 98–111)
Creatinine, Ser: 0.91 mg/dL (ref 0.44–1.00)
GFR, Estimated: >60 mL/min (ref >60)
Glucose, Bld: 118 mg/dL — ABNORMAL HIGH (ref 70–99)
Potassium: 3 mmol/L — ABNORMAL LOW (ref 3.5–5.1)
Sodium: 140 mmol/L (ref 135–145)
Total Bilirubin: 0.5 mg/dL (ref 0.0–1.2)
Total Protein: 8.4 g/dL — ABNORMAL HIGH (ref 6.5–8.1)

## 2023-07-05 LAB — CBG MONITORING, ED: Glucose-Capillary: 95 mg/dL (ref 70–99)

## 2023-07-05 LAB — CBC
HCT: 41.1 % (ref 36.0–46.0)
Hemoglobin: 14 g/dL (ref 12.0–15.0)
MCH: 29.4 pg (ref 26.0–34.0)
MCHC: 34.1 g/dL (ref 30.0–36.0)
MCV: 86.3 fL (ref 80.0–100.0)
Platelets: 345 10*3/uL (ref 150–400)
RBC: 4.76 MIL/uL (ref 3.87–5.11)
RDW: 12.5 % (ref 11.5–15.5)
WBC: 5.4 10*3/uL (ref 4.0–10.5)
nRBC: 0 % (ref 0.0–0.2)

## 2023-07-05 LAB — TROPONIN T, HIGH SENSITIVITY: Troponin T High Sensitivity: <15 ng/L (ref <19)

## 2023-07-05 MED ORDER — MAGNESIUM SULFATE 2 GM/50ML IV SOLN
2.0000 g | Freq: Once | INTRAVENOUS | Status: AC
  Administered 2023-07-05: 2 g via INTRAVENOUS
  Filled 2023-07-05: qty 50

## 2023-07-05 MED ORDER — METOCLOPRAMIDE HCL 5 MG/ML IJ SOLN
10.0000 mg | Freq: Once | INTRAMUSCULAR | Status: AC
  Administered 2023-07-05: 10 mg via INTRAVENOUS
  Filled 2023-07-05: qty 2

## 2023-07-05 MED ORDER — MECLIZINE HCL 25 MG PO TABS
25.0000 mg | ORAL_TABLET | Freq: Once | ORAL | Status: AC
  Administered 2023-07-05: 25 mg via ORAL
  Filled 2023-07-05: qty 1

## 2023-07-05 MED ORDER — ACETAMINOPHEN 500 MG PO TABS
1000.0000 mg | ORAL_TABLET | Freq: Once | ORAL | Status: AC
  Administered 2023-07-05: 1000 mg via ORAL
  Filled 2023-07-05: qty 2

## 2023-07-05 MED ORDER — MECLIZINE HCL 25 MG PO TABS: 25.0000 mg | ORAL_TABLET | Freq: Three times a day (TID) | ORAL | 0 refills | Status: AC | PRN

## 2023-07-05 MED ORDER — METOCLOPRAMIDE HCL 10 MG PO TABS: 10.0000 mg | ORAL_TABLET | Freq: Three times a day (TID) | ORAL | 0 refills | Status: AC | PRN

## 2023-07-05 MED ORDER — POTASSIUM CHLORIDE 10 MEQ/100ML IV SOLN
10.0000 meq | Freq: Once | INTRAVENOUS | Status: AC
  Administered 2023-07-05: 10 meq via INTRAVENOUS
  Filled 2023-07-05: qty 100

## 2023-07-05 MED ORDER — DIPHENHYDRAMINE HCL 50 MG/ML IJ SOLN
50.0000 mg | Freq: Once | INTRAMUSCULAR | Status: AC
  Administered 2023-07-05: 50 mg via INTRAVENOUS
  Filled 2023-07-05: qty 1

## 2023-07-05 MED ORDER — POTASSIUM CHLORIDE CRYS ER 20 MEQ PO TBCR
40.0000 meq | EXTENDED_RELEASE_TABLET | Freq: Once | ORAL | Status: AC
  Administered 2023-07-05: 40 meq via ORAL
  Filled 2023-07-05: qty 2

## 2023-07-05 MED ORDER — DIPHENHYDRAMINE HCL 25 MG PO CAPS: 50.0000 mg | ORAL_CAPSULE | Freq: Once | ORAL | Status: AC

## 2023-07-05 MED ORDER — SCOPOLAMINE 1 MG/3DAYS TD PT72: 1.0000 | MEDICATED_PATCH | TRANSDERMAL | Status: DC

## 2023-07-05 MED ORDER — METHYLPREDNISOLONE SODIUM SUCC 40 MG IJ SOLR
40.0000 mg | Freq: Once | INTRAMUSCULAR | Status: AC
  Administered 2023-07-05: 40 mg via INTRAVENOUS
  Filled 2023-07-05: qty 1

## 2023-07-05 NOTE — ED Provider Notes (Signed)
 11:18 PM Assumed care from Dr. Linder Revere, please see their note for full history, physical and decision making until this point. In brief this is a 68 y.o. year old female who presented to the ED tonight with Dizziness     Pending cta head/neck for vertigo. Feeling better. Likely d/c if ok.   Cta w/ possible vert dissection. As this is an anatomic area that could cause a cva that would cause her current symptoms I d/w Dr. Cleone Dad with neurology and will get an MRI to rule out acute cva. If subacute or chronic then optimize medically and d/c with vertigo PT, neuro follow up.  Drs. Pollina and Floyd at [facility] aware and accept in transfer for MRI and above plan.   Labs, studies and imaging reviewed by myself and considered in medical decision making if ordered. Imaging interpreted by radiology.  Labs Reviewed  COMPREHENSIVE METABOLIC PANEL WITH GFR - Abnormal; Notable for the following components:      Result Value   Potassium 3.0 (*)    Chloride 96 (*)    Glucose, Bld 118 (*)    Total Protein 8.4 (*)    Anion gap 17 (*)    All other components within normal limits  CBC  URINALYSIS, ROUTINE W REFLEX MICROSCOPIC  CBG MONITORING, ED  TROPONIN T, HIGH SENSITIVITY    DG Chest Portable 1 View  Final Result    CT ANGIO HEAD NECK W WO CM    (Results Pending)    No follow-ups on file.    Jaelan Rasheed, Reymundo Caulk, MD 07/06/23 [phone]

## 2023-07-05 NOTE — ED Provider Notes (Signed)
 [place] [facility] Provider Note   CSN: [id number] Arrival date & time: 07/05/23  2001     Patient presents with: Dizziness   Karen Sutton is a 68 y.o. female.   This is a 68 year old female presenting to the emergency department for vertigo and headaches.  Reports that she has had vertigo in the past and feels similar, but more intense.  She reports symptoms worsen with head movement, improved with rest.  But notes it is near constant currently.  She also notes that she has had daily headache.  Frontal.  No vision changes, facial droop or unilateral weakness.  She also thinks that her face is somewhat swollen which she noticed over the past week.  No new exposures.  Lip swelling, tongue swelling, difficulty swallowing difficulty breathing.   Dizziness      Prior to Admission medications   Medication Sig Start Date End Date Taking? Authorizing Provider  meclizine (ANTIVERT) 25 MG tablet Take 1 tablet (25 mg total) by mouth 3 (three) times daily as needed for dizziness. 07/05/23  Yes Rolinda Climes, DO  metoCLOPramide (REGLAN) 10 MG tablet Take 1 tablet (10 mg total) by mouth every 8 (eight) hours as needed for nausea. 07/05/23  Yes Rolinda Climes, DO  aspirin EC 81 MG tablet Take 81 mg by mouth every other day.     [provider]  cetirizine -pseudoephedrine  (ZYRTEC -D) 5-120 MG per tablet Take 1 tablet by mouth 2 (two) times daily. 09/01/12   Carleton Cheek, PA-C  diphenhydrAMINE (BENADRYL) 25 MG tablet Take 25 mg by mouth every 6 (six) hours as needed.    [provider]  hydrochlorothiazide (HYDRODIURIL) 25 MG tablet Take 25 mg by mouth daily.      [provider]    Allergies: Sulfa drugs cross reactors, Ibuprofen, Ivp dye [iodinated contrast media], and Metformin and related    Review of Systems  Neurological:  Positive for dizziness.    Updated Vital Signs BP 134/78   Pulse 71   Temp 97.8 F (36.6 C)    Resp 14   Ht 5' 4 (1.626 m)   Wt 64.4 kg   LMP 09/11/2010   SpO2 99%   BMI 24.37 kg/m   Physical Exam Vitals and nursing note reviewed.  Constitutional:      General: She is not in acute distress.    Appearance: She is not toxic-appearing.  HENT:     Head: Normocephalic.     Right Ear: Tympanic membrane normal.     Left Ear: Tympanic membrane normal.     Nose: Nose normal.     Mouth/Throat:     Mouth: Mucous membranes are moist.   Eyes:     Conjunctiva/sclera: Conjunctivae normal.    Cardiovascular:     Rate and Rhythm: Normal rate and regular rhythm.  Pulmonary:     Effort: Pulmonary effort is normal.     Breath sounds: Normal breath sounds.  Abdominal:     General: Abdomen is flat. There is no distension.     Palpations: Abdomen is soft.     Tenderness: There is no abdominal tenderness. There is no guarding or rebound.   Musculoskeletal:        General: Normal range of motion.   Skin:    General: Skin is warm and dry.     Capillary Refill: Capillary refill takes less than 2 seconds.   Neurological:     General: No focal deficit  present.     Mental Status: She is alert and oriented to person, place, and time.     Cranial Nerves: No cranial nerve deficit.     Sensory: No sensory deficit.     Motor: No weakness.     Coordination: Coordination normal.   Psychiatric:        Mood and Affect: Mood normal.        Behavior: Behavior normal.     (all labs ordered are listed, but only abnormal results are displayed) Labs Reviewed  COMPREHENSIVE METABOLIC PANEL WITH GFR - Abnormal; Notable for the following components:      Result Value   Potassium 3.0 (*)    Chloride 96 (*)    Glucose, Bld 118 (*)    Total Protein 8.4 (*)    Anion gap 17 (*)    All other components within normal limits  CBC  URINALYSIS, ROUTINE W REFLEX MICROSCOPIC  CBG MONITORING, ED  TROPONIN T, HIGH SENSITIVITY    EKG: EKG Interpretation Date/Time:  Tuesday July 05 2023 20:19:54  EDT Ventricular Rate:  83 PR Interval:  156 QRS Duration:  82 QT Interval:  364 QTC Calculation: 428 R Axis:   64  Text Interpretation: Sinus rhythm Probable LVH with secondary repol abnrm Confirmed by Elise Guile [id number]) on 07/05/2023 11:11:31 PM  Radiology: [facility] Query Chest Portable 1 View Result Date: 07/05/2023 CLINICAL DATA:  lightheaded EXAM: PORTABLE CHEST 1 VIEW COMPARISON:  Chest x-ray 09/22/2022 FINDINGS: Patient is rotated. The heart and mediastinal contours are unchanged. No focal consolidation. No pulmonary edema. No pleural effusion. No pneumothorax. No acute osseous abnormality. IMPRESSION: No active disease. Electronically Signed   By: Morgane  Naveau M.D.   On: 07/05/2023 21:33     Procedures   Medications Ordered in the ED  diphenhydrAMINE (BENADRYL) capsule 50 mg (has no administration in time range)    Or  diphenhydrAMINE (BENADRYL) injection 50 mg (has no administration in time range)  methylPREDNISolone sodium succinate (SOLU-MEDROL) 40 mg/mL injection 40 mg (40 mg Intravenous Given 07/05/23 2042)  meclizine (ANTIVERT) tablet 25 mg (25 mg Oral Given 07/05/23 2041)  potassium chloride SA (KLOR-CON M) CR tablet 40 mEq (40 mEq Oral Given 07/05/23 2212)  acetaminophen  (TYLENOL ) tablet 1,000 mg (1,000 mg Oral Given 07/05/23 2212)  metoCLOPramide (REGLAN) injection 10 mg (10 mg Intravenous Given 07/05/23 2214)    Clinical Course as of 07/05/23 2312  Tue Jul 05, 2023  2013 CT head in 2011: CT HEAD    Findings: There is no evidence for acute infarction, intracranial  hemorrhage, mass lesion, hydrocephalus, or extra-axial fluid. There  is no atrophy or white matter disease.  The calvarium is intact.  There is no sinus or mastoid fluid.   [TY]  2107 CBC No leukocytosis.  No anemia [TY]    Clinical Course User Index [TY] Rolinda Climes, DO                                 Medical Decision Making This is a 68 year old female history of hypertension, diabetes, CAD,  obesity presenting to the emergency department with chief complaint of vertigo and headaches.  She is afebrile nontachycardic, elevated blood pressure on arrival 187/96.  Improved without intervention.  Physical exam largely reassuring with no focal neurologic deficits.  Coordinated movements.  I suspect peripheral cause given symptoms worsening with position.  Basic labs with low potassium.  Repleted.  No AKI  or transaminitis.  CBC without leukocytosis or anemia.  She did note some lightheadedness intermittently as well, cardiac cause unlikely with troponin negative EKG normal sinus rhythm. Given meclizine, Reglan, Tylenol . Some improvement of symptoms on re-evaluation, but still present. CTA ordered and pending. Care signed out pending CTA results and re-evaluation.   Amount and/or Complexity of Data Reviewed External Data Reviewed:     Details: See ED course.  Labs: ordered. Decision-making details documented in ED Course. Radiology: ordered and independent interpretation performed. ECG/medicine tests: independent interpretation performed.  Risk OTC drugs. Prescription drug management. Decision regarding hospitalization. Diagnosis or treatment significantly limited by social determinants of health.      Final diagnoses:  Vertigo    ED Discharge Orders          Ordered    meclizine (ANTIVERT) 25 MG tablet  3 times daily PRN        07/05/23 2308    metoCLOPramide (REGLAN) 10 MG tablet  Every 8 hours PRN        07/05/23 2308               Rolinda Climes, DO 07/05/23 2312

## 2023-07-05 NOTE — ED Triage Notes (Signed)
 Pt c/o vertigo, dizziness, headaches x 1 month. She notes that her face is swollen; slight swelling noted to bilateral cheeks, first noted a week ago with improvement after taking her BP pills. HTN 187/96 and tachy 110HR noted in triage.

## 2023-07-05 NOTE — ED Notes (Signed)
 Pt laid flat in bed at 2125. Will do orthostatic vitals in 10 mins.

## 2023-07-05 NOTE — Discharge Instructions (Addendum)
 Please follow-up with your family doctor and the neurologist in the office.

## 2023-07-05 NOTE — ED Notes (Signed)
 Pt was asked again about voiding and pt stated they did not need to void. This tech provided call light and let pt know to call when they need to void.

## 2023-07-06 ENCOUNTER — Emergency Department (HOSPITAL_COMMUNITY)

## 2023-07-06 ENCOUNTER — Emergency Department (HOSPITAL_BASED_OUTPATIENT_CLINIC_OR_DEPARTMENT_OTHER)

## 2023-07-06 DIAGNOSIS — R42 Dizziness and giddiness: Secondary | ICD-10-CM | POA: Diagnosis not present

## 2023-07-06 LAB — URINALYSIS, ROUTINE W REFLEX MICROSCOPIC
Bilirubin Urine: NEGATIVE
Glucose, UA: NEGATIVE mg/dL
Hgb urine dipstick: NEGATIVE
Ketones, ur: NEGATIVE mg/dL
Nitrite: NEGATIVE
Protein, ur: NEGATIVE mg/dL
Specific Gravity, Urine: 1.01 (ref 1.005–1.030)
pH: 5.5 (ref 5.0–8.0)

## 2023-07-06 LAB — URINALYSIS, MICROSCOPIC (REFLEX)

## 2023-07-06 LAB — MAGNESIUM: Magnesium: 1.9 mg/dL (ref 1.7–2.4)

## 2023-07-06 MED ORDER — ACETAMINOPHEN 325 MG PO TABS
650.0000 mg | ORAL_TABLET | Freq: Once | ORAL | Status: AC
  Administered 2023-07-06: 650 mg via ORAL
  Filled 2023-07-06: qty 2

## 2023-07-06 MED ORDER — POTASSIUM CHLORIDE CRYS ER 20 MEQ PO TBCR: 40.0000 meq | EXTENDED_RELEASE_TABLET | Freq: Every day | ORAL | 0 refills | Status: AC

## 2023-07-06 MED ORDER — CLOPIDOGREL BISULFATE 75 MG PO TABS
75.0000 mg | ORAL_TABLET | Freq: Every day | ORAL | 0 refills | Status: AC
Start: 2023-07-06 — End: ?

## 2023-07-06 MED ORDER — MECLIZINE HCL 25 MG PO TABS: 25.0000 mg | ORAL_TABLET | Freq: Three times a day (TID) | ORAL | 0 refills | Status: AC | PRN

## 2023-07-06 MED ORDER — IOHEXOL 350 MG/ML SOLN
75.0000 mL | Freq: Once | INTRAVENOUS | Status: AC | PRN
  Administered 2023-07-06: 75 mL via INTRAVENOUS

## 2023-07-06 MED ORDER — DIPHENHYDRAMINE HCL 50 MG/ML IJ SOLN
25.0000 mg | Freq: Once | INTRAMUSCULAR | Status: AC
  Administered 2023-07-06: 25 mg via INTRAVENOUS
  Filled 2023-07-06: qty 1

## 2023-07-06 MED ORDER — PROCHLORPERAZINE EDISYLATE 10 MG/2ML IJ SOLN
10.0000 mg | Freq: Once | INTRAMUSCULAR | Status: AC
  Administered 2023-07-06: 10 mg via INTRAVENOUS
  Filled 2023-07-06: qty 2

## 2023-07-06 NOTE — ED Provider Notes (Signed)
 I think you have the patient's upon arrival to the emergency department.  She was transfered from [facility].  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  Plan for MRI.  MRI is negative.  Per plan will start on Plavix.  Have her follow-up with neurology as an outpatient.   Albertus Hughs, DO 07/06/23 [phone]

## 2023-07-06 NOTE — ED Notes (Signed)
 Patient transported to CT

## 2023-07-06 NOTE — ED Notes (Signed)
 Pt BIB [facility] from [facility]. Reports pt presented with HA, dizziness, and vertigo that began 1 month ago. Reports pt was initially hypertensive, but more recent SBP 120-130s. A&Ox4. Pt will need MRI, EDP notified of pt arrival

## 2023-07-06 NOTE — ED Notes (Signed)
 Patient transported to MRI

## 2023-09-12 ENCOUNTER — Ambulatory Visit: Admitting: Neurology

## 2023-09-12 ENCOUNTER — Encounter: Payer: Self-pay | Admitting: Neurology
